# Patient Record
Sex: Male | Born: 1965 | Race: White | Hispanic: No | Marital: Married
Health system: Southern US, Community
[De-identification: ages and names within clinical notes are randomized; demographics above are authoritative.]

---

## 2009-06-12 ENCOUNTER — Ambulatory Visit: Payer: Self-pay | Admitting: Unknown Physician Specialty

## 2018-06-02 ENCOUNTER — Emergency Department: Payer: BLUE CROSS/BLUE SHIELD

## 2018-06-02 ENCOUNTER — Emergency Department
Admission: EM | Admit: 2018-06-02 | Discharge: 2018-06-02 | Disposition: A | Discharge status: Home / Self Care | Payer: BLUE CROSS/BLUE SHIELD | Attending: Emergency Medicine | Admitting: Emergency Medicine

## 2018-06-02 ENCOUNTER — Other Ambulatory Visit: Payer: Self-pay

## 2018-06-02 DIAGNOSIS — R109 Unspecified abdominal pain: Secondary | ICD-10-CM | POA: Insufficient documentation

## 2018-06-02 DIAGNOSIS — N201 Calculus of ureter: Secondary | ICD-10-CM | POA: Diagnosis not present

## 2018-06-02 DIAGNOSIS — R112 Nausea with vomiting, unspecified: Secondary | ICD-10-CM | POA: Diagnosis not present

## 2018-06-02 LAB — COMPREHENSIVE METABOLIC PANEL
ALBUMIN: 4.5 g/dL (ref 3.5–5.0)
ALK PHOS: 72 U/L (ref 38–126)
ALT: 19 U/L (ref 0–44)
ANION GAP: 9 (ref 5–15)
AST: 23 U/L (ref 15–41)
BILIRUBIN TOTAL: 1.4 mg/dL — AB (ref 0.3–1.2)
BUN: 15 mg/dL (ref 6–20)
CALCIUM: 9.2 mg/dL (ref 8.9–10.3)
CO2: 26 mmol/L (ref 22–32)
Chloride: 105 mmol/L (ref 98–111)
Creatinine, Ser: 0.87 mg/dL (ref 0.61–1.24)
GLUCOSE: 125 mg/dL — AB (ref 70–99)
Potassium: 3.3 mmol/L — ABNORMAL LOW (ref 3.5–5.1)
SODIUM: 140 mmol/L (ref 135–145)
TOTAL PROTEIN: 7.1 g/dL (ref 6.5–8.1)

## 2018-06-02 LAB — URINALYSIS, COMPLETE (UACMP) WITH MICROSCOPIC
Bacteria, UA: NONE SEEN
Bilirubin Urine: NEGATIVE
Glucose, UA: NEGATIVE mg/dL
KETONES UR: NEGATIVE mg/dL
Leukocytes, UA: NEGATIVE
Nitrite: NEGATIVE
PH: 8 (ref 5.0–8.0)
PROTEIN: NEGATIVE mg/dL
RBC / HPF: >50 RBC/hpf — ABNORMAL HIGH (ref 0–5)
SQUAMOUS EPITHELIAL / LPF: NONE SEEN (ref 0–5)
Specific Gravity, Urine: 1.01 (ref 1.005–1.030)

## 2018-06-02 LAB — CBC
HEMATOCRIT: 44.8 % (ref 40.0–52.0)
HEMOGLOBIN: 15.4 g/dL (ref 13.0–18.0)
MCH: 30.1 pg (ref 26.0–34.0)
MCHC: 34.3 g/dL (ref 32.0–36.0)
MCV: 87.7 fL (ref 80.0–100.0)
Platelets: 260 10*3/uL (ref 150–440)
RBC: 5.11 MIL/uL (ref 4.40–5.90)
RDW: 13.5 % (ref 11.5–14.5)
WBC: 5.9 10*3/uL (ref 3.8–10.6)

## 2018-06-02 MED ORDER — ONDANSETRON HCL 4 MG/2ML IJ SOLN
4.0000 mg | Freq: Once | INTRAMUSCULAR | Status: AC
  Administered 2018-06-02: 4 mg via INTRAVENOUS
  Filled 2018-06-02: qty 2

## 2018-06-02 MED ORDER — SODIUM CHLORIDE 0.9 % IV SOLN
1000.0000 mL | Freq: Once | INTRAVENOUS | Status: AC
  Administered 2018-06-02: 1000 mL via INTRAVENOUS

## 2018-06-02 MED ORDER — KETOROLAC TROMETHAMINE 10 MG PO TABS: 10.0000 mg | ORAL_TABLET | Freq: Four times a day (QID) | ORAL | 0 refills | Status: AC | PRN

## 2018-06-02 MED ORDER — KETOROLAC TROMETHAMINE 30 MG/ML IJ SOLN
30.0000 mg | Freq: Once | INTRAMUSCULAR | Status: AC
  Administered 2018-06-02: 30 mg via INTRAVENOUS
  Filled 2018-06-02: qty 1

## 2018-06-02 NOTE — Discharge Instructions (Signed)
Your CT scan shows a 2 mm stone just outside the bladder.  When this passes into your bladder your symptoms will dramatically improve.  Your urine test does not show any signs of infection.

## 2018-06-02 NOTE — ED Provider Notes (Signed)
[facility] Emergency Department Provider Note   ____________________________________________    I have reviewed the triage vital signs and the nursing notes.   HISTORY  Chief Complaint Flank pain    HPI Luke Barnett is a 52 y.o. male who presents with complaints of back pain/flank pain.  Patient reports he was at work sitting at his desk when he developed severe right back/right flank pain.  He went and urinated and reports the pain became much worse.  He developed nausea and vomiting.  He is never had this before.  No history of kidney stones.  No fevers or chills or dysuria.  Has not taken anything for this.  Received fentanyl from EMS.    History reviewed. No pertinent past medical history.  There are no active problems to display for this patient.   History reviewed. No pertinent surgical history.  Prior to Admission medications   Not on File     Allergies Patient has no allergy information on record.  No family history on file.  Social History Social History   Tobacco Use  . Smoking status: Not on file  Substance Use Topics  . Alcohol use: Not on file  . Drug use: Not on file    Review of Systems  Constitutional: No fever/chills Eyes: No visual changes.  ENT: No sore throat. Cardiovascular: Denies chest pain. Respiratory: Denies shortness of breath. Gastrointestinal: As above.   Genitourinary: Negative for dysuria. Musculoskeletal: As above Skin: Negative for rash. Neurological: Negative for headaches   ____________________________________________   PHYSICAL EXAM:  VITAL SIGNS: ED Triage Vitals  Enc Vitals Group     BP 06/02/18 1418 99/70     Pulse Rate 06/02/18 1418 78     Resp 06/02/18 1418 20     Temp 06/02/18 1418 98.1 F (36.7 C)     Temp Source 06/02/18 1418 Oral     SpO2 06/02/18 1418 100 %     Weight 06/02/18 1419 59 kg (130 lb)     Height 06/02/18 1419 1.803 m (5\' 11" )     Head  Circumference --      Peak Flow --      Pain Score 06/02/18 1419 6     Pain Loc --      Pain Edu? --      Excl. in GC? --     Constitutional: Alert and oriented.  Patient is lying prone on the bed attempting to vomit Eyes: Conjunctivae are normal.   Nose: No congestion/rhinnorhea. Mouth/Throat: Mucous membranes are moist.    Cardiovascular: Normal rate, regular rhythm. Grossly normal heart sounds.  Good peripheral circulation. Respiratory: Normal respiratory effort.  No retractions. Lungs CTAB. Gastrointestinal: Soft and nontender. No distention.  No CVA tenderness. Genitourinary: deferred Musculoskeletal: Back: Right lumbar paraspinal tenderness suspicious for muscle spasm, warm and well perfused extremities Neurologic:  Normal speech and language. No gross focal neurologic deficits are appreciated.  Skin:  Skin is warm, dry and intact. No rash noted. Psychiatric: Mood and affect are normal. Speech and behavior are normal.  ____________________________________________   LABS (all labs ordered are listed, but only abnormal results are displayed)  Labs Reviewed  CBC  URINALYSIS, COMPLETE (UACMP) WITH MICROSCOPIC  COMPREHENSIVE METABOLIC PANEL  LIPASE, BLOOD   ____________________________________________  EKG   ____________________________________________  RADIOLOGY  CT renal stone study ____________________________________________   PROCEDURES  Procedure(s) performed: No  Procedures   Critical Care performed: No ____________________________________________   INITIAL IMPRESSION / ASSESSMENT AND  PLAN / ED COURSE  Pertinent labs & imaging results that were available during my care of the patient were reviewed by me and considered in my medical decision making (see chart for details).  Patient presents with significant right flank/back pain.  Sudden onset of pain and nausea suspicious for ureterolithiasis however area of maximal tenderness seems more  consistent with muscular skeletal back pain.  We will give IV Toradol, check labs, urinalysis and obtain CT renal stone study and reevaluate   Will ask my partner to reevaluate after CT results    ____________________________________________   FINAL CLINICAL IMPRESSION(S) / ED DIAGNOSES  Final diagnoses:  Flank pain, acute        Note:  This document was prepared using Dragon voice recognition software and may include unintentional dictation errors.    Jene Every, MD 06/02/18 1452

## 2018-06-02 NOTE — ED Provider Notes (Signed)
CT shows a 1 to 2 mm right UVJ stone, nonobstructive.  No evidence of infection in the urine.  Vital signs remain normal.  Pain is dramatically improved after CT and Toradol.  She is stable for discharge home in good condition.   Sharman CheekStafford, Rosi Secrist, MD 06/02/18 [phone]

## 2018-06-02 NOTE — ED Triage Notes (Signed)
Pt comes via [facility] from work with c/o RLQ pain. Pt given 50mcg of fent and helped with pain a little. EMS gave another 25 mcg fent and 8 mg of zofran. Pt states pain 7/10. Pt vomited per EMS and has had nausea. Pt is alert. VSS

## 2018-06-02 NOTE — ED Notes (Signed)
Patient transported to CT 

## 2019-09-19 IMAGING — CT CT RENAL STONE PROTOCOL
2 of 4 series · 15 of 46 positions shown, 17 images · non-contrast
Comparison: None.

CLINICAL DATA: Acute onset of severe RIGHT sided back pain/RIGHT
flank pain while sitting at his desk at work earlier today. The pain
worsened after urination and he developed nausea and vomiting. No
history of urinary tract calculi.

EXAM:
CT ABDOMEN AND PELVIS WITHOUT CONTRAST
TECHNIQUE: Multidetector CT imaging of the abdomen and pelvis was performed
following the standard protocol without IV contrast.

[Series 2: stone full standard · axial · 0.62mm/px · z∈[-1065,-650]mm · 12 of 91 slices shown, 14 images]
[im 4/91  soft-tissue]
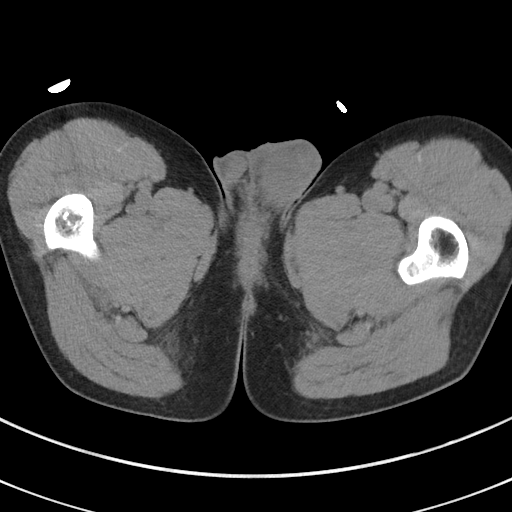
[im 4/91  bone]
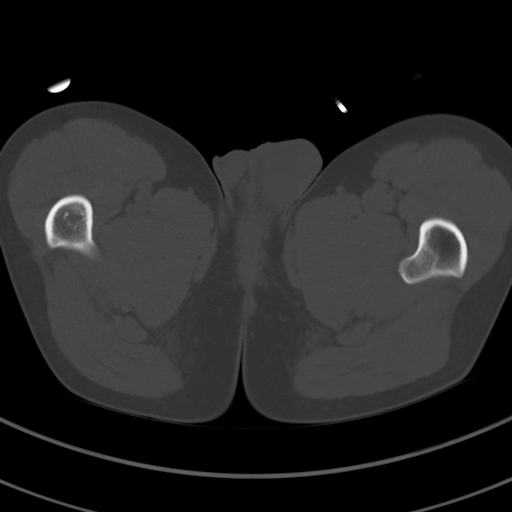
[im 12/91  soft-tissue]
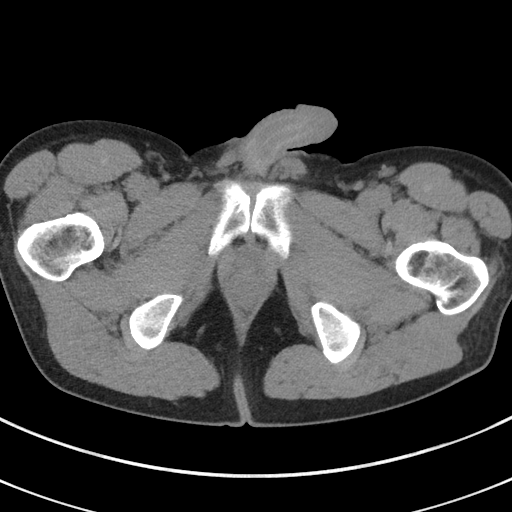
[im 19/91  soft-tissue]
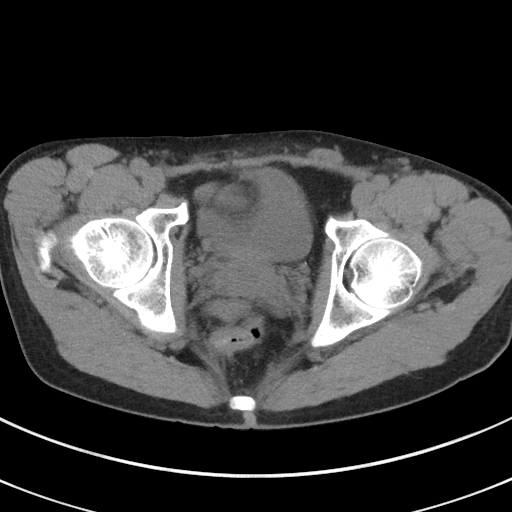
[im 27/91  soft-tissue]
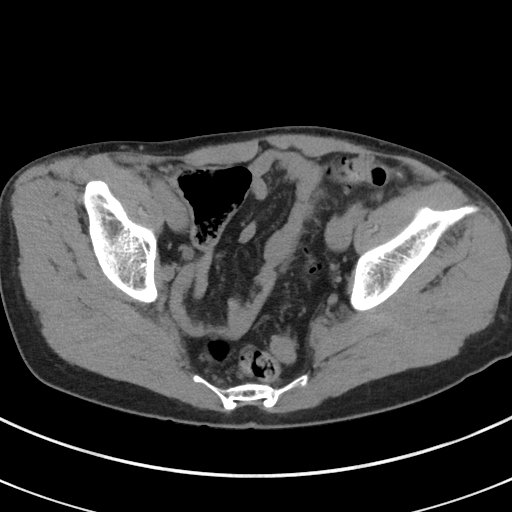
[im 34/91  soft-tissue]
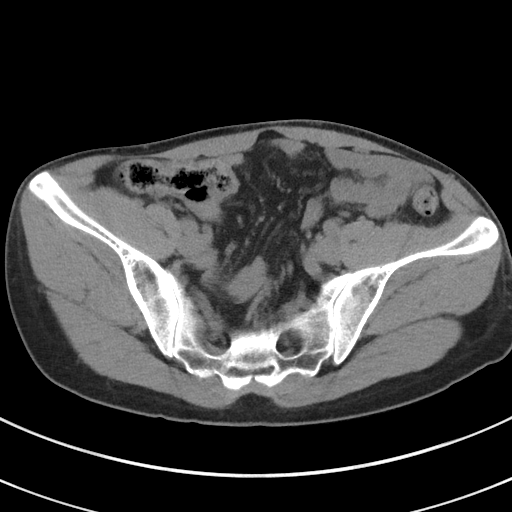
[im 42/91  soft-tissue]
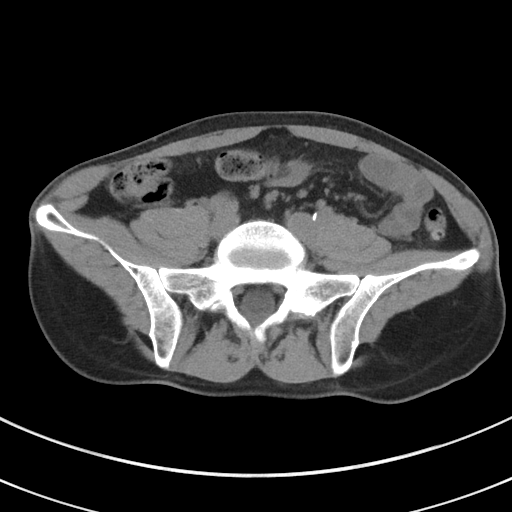
[im 49/91  soft-tissue]
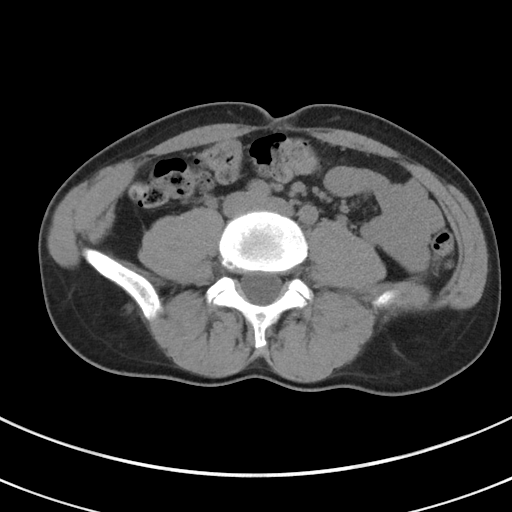
[im 57/91  soft-tissue]
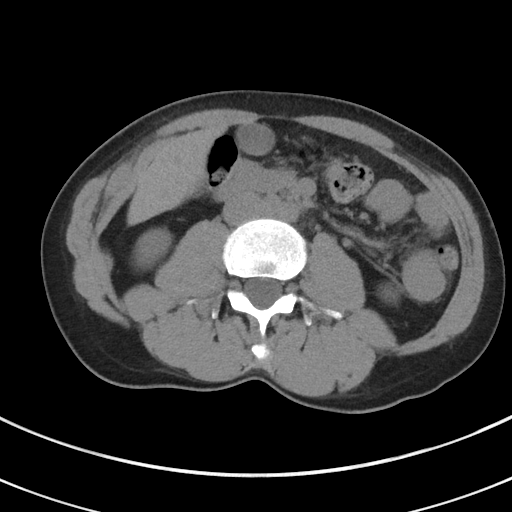
[im 64/91  soft-tissue]
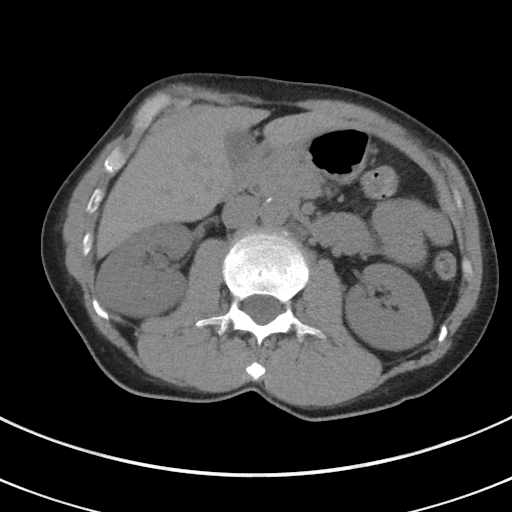
[im 64/91  bone]
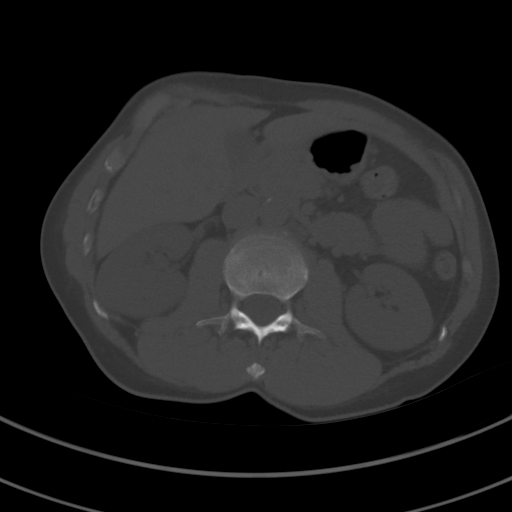
[im 72/91  soft-tissue]
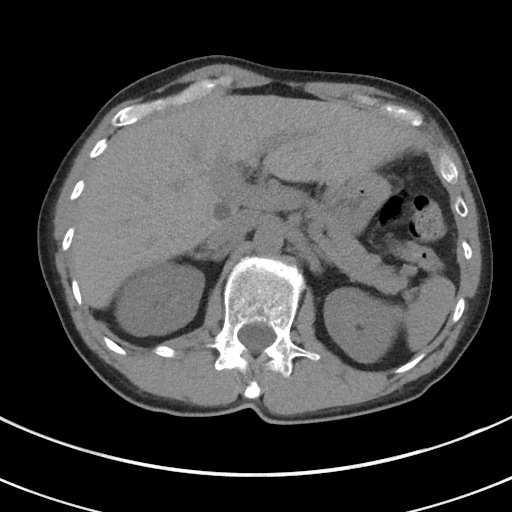
[im 79/91  soft-tissue]
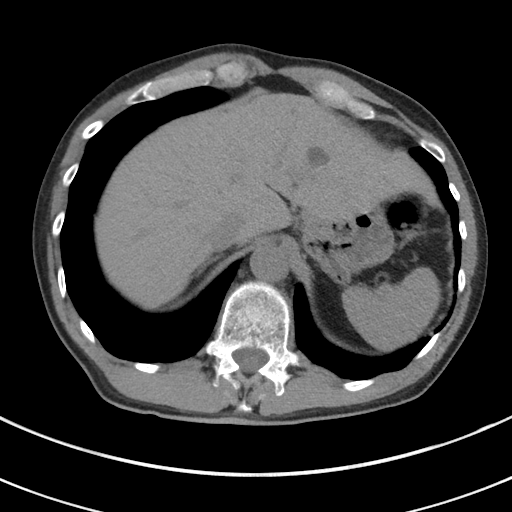
[im 87/91  soft-tissue]
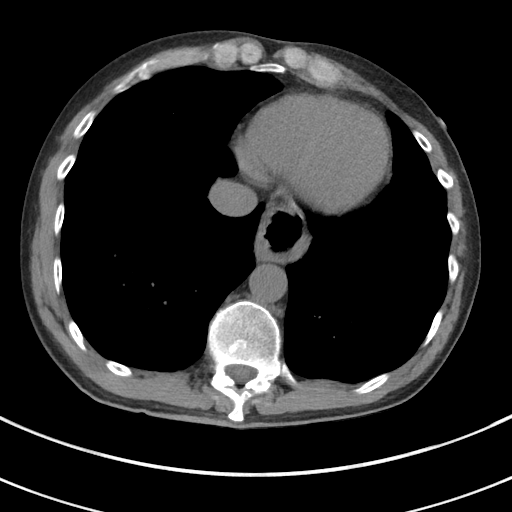

[Series 5: coronal · coronal · 0.67mm/px · 3 of 103 slices shown]
[im 35/103  soft-tissue]
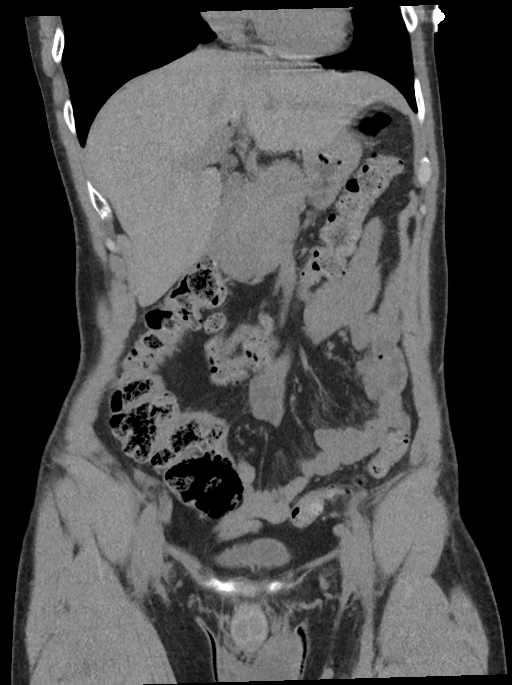
[im 46/103  soft-tissue]
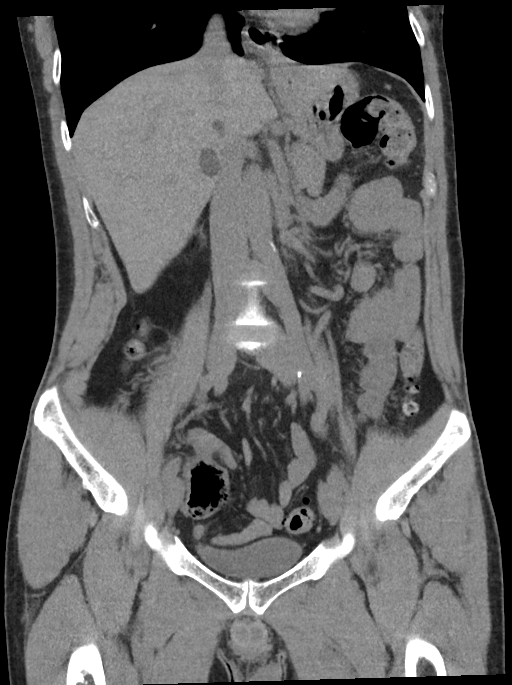
[im 57/103  soft-tissue]
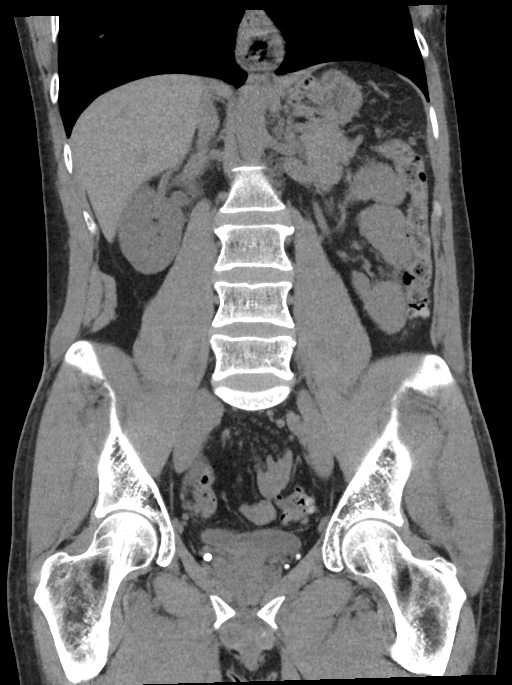

[15 of 46 positions shown; findings below may reference images not displayed]

FINDINGS: Lower chest: Respiratory motion blurred images of the lung bases.
Visualized lung bases clear. Heart size normal. Of note, the scout
image demonstrates hyperinflation with flattening of the
hemidiaphragms.

Hepatobiliary: Multiple low-attenuation lesions throughout the
liver, the largest in the caudate lobe measuring approximately 2.4 x
1.9 cm, all with Hounsfield measurements 10 or less, indicating
benign cysts. No solid renal masses, allowing for the unenhanced
technique.

Gallbladder normal in appearance without calcified gallstones. No
biliary ductal dilation.

Pancreas: Normal unenhanced appearance.

Spleen: Normal unenhanced appearance.

Adrenals/Urinary Tract: Normal appearing adrenal glands. Very small
(1-2 mm) calculus which is in the distal RIGHT ureter at the UVJ,
causing mild RIGHT hydronephrosis and moderate RIGHT renal edema. No
urinary tract calculi elsewhere on either side. No focal parenchymal
abnormality involving either kidney. Urinary bladder decompressed
and unremarkable.

Stomach/Bowel: Small to moderate-sized hiatal hernia. Stomach
otherwise normal in appearance. Normal-appearing small bowel.
Diverticulosis involving the ascending colon, descending colon and
sigmoid colon without evidence of diverticulitis. Moderate colonic
stool burden. Cecum extends low in the RIGHT side of the pelvis.
Normal appendix in the RIGHT mid and low pelvis.

Vascular/Lymphatic: Mild aortoiliac atherosclerosis without evidence
of aneurysm. No pathologic lymphadenopathy.

Reproductive: Normal sized prostate gland containing calcifications.
Normal seminal vesicles.

Other: Phleboliths in both sides of the low pelvis.

Musculoskeletal: Regional skeleton unremarkable without acute or
significant osseous abnormality.
IMPRESSION: 1. Obstructing very small (1-2 mm) calculus in the distal RIGHT
ureter at the UVJ.
2. No urinary tract calculi elsewhere on either side.
3. Small to moderate-sized hiatal hernia.
4. Diverticulosis involving the ascending, descending and sigmoid
colon without evidence of acute diverticulitis.
5. Multiple benign hepatic cysts.
6.  Aortic Atherosclerosis (LLDC2-170.0)

## 2023-04-04 ENCOUNTER — Ambulatory Visit (INDEPENDENT_AMBULATORY_CARE_PROVIDER_SITE_OTHER): Payer: No Typology Code available for payment source

## 2023-04-04 DIAGNOSIS — K222 Esophageal obstruction: Secondary | ICD-10-CM

## 2023-04-04 DIAGNOSIS — K449 Diaphragmatic hernia without obstruction or gangrene: Secondary | ICD-10-CM

## 2023-04-04 DIAGNOSIS — K635 Polyp of colon: Secondary | ICD-10-CM

## 2023-04-04 DIAGNOSIS — K2970 Gastritis, unspecified, without bleeding: Secondary | ICD-10-CM

## 2023-04-04 DIAGNOSIS — K640 First degree hemorrhoids: Secondary | ICD-10-CM

## 2023-04-04 DIAGNOSIS — D122 Benign neoplasm of ascending colon: Secondary | ICD-10-CM

## 2023-04-04 DIAGNOSIS — K2100 Gastro-esophageal reflux disease with esophagitis, without bleeding: Secondary | ICD-10-CM

## 2023-04-04 DIAGNOSIS — Z1211 Encounter for screening for malignant neoplasm of colon: Secondary | ICD-10-CM | POA: Diagnosis not present

## 2023-04-04 DIAGNOSIS — K5730 Diverticulosis of large intestine without perforation or abscess without bleeding: Secondary | ICD-10-CM
